# Patient Record
Sex: Male | Born: 1990 | Race: Black or African American | Hispanic: No | Marital: Single | Smoking: Never smoker
Health system: Southern US, Community
[De-identification: ages and names within clinical notes are randomized; demographics above are authoritative.]

---

## 2016-07-10 ENCOUNTER — Encounter: Payer: Self-pay | Admitting: *Deleted

## 2016-07-10 ENCOUNTER — Emergency Department: Payer: Self-pay

## 2016-07-10 ENCOUNTER — Emergency Department
Admission: EM | Admit: 2016-07-10 | Discharge: 2016-07-10 | Disposition: A | Discharge status: Home / Self Care | Payer: Self-pay | Attending: Emergency Medicine | Admitting: Emergency Medicine

## 2016-07-10 DIAGNOSIS — G43909 Migraine, unspecified, not intractable, without status migrainosus: Secondary | ICD-10-CM | POA: Insufficient documentation

## 2016-07-10 MED ORDER — KETOROLAC TROMETHAMINE 60 MG/2ML IM SOLN
60.0000 mg | Freq: Once | INTRAMUSCULAR | Status: AC
  Administered 2016-07-10: 60 mg via INTRAMUSCULAR
  Filled 2016-07-10: qty 2

## 2016-07-10 MED ORDER — ONDANSETRON 4 MG PO TBDP
ORAL_TABLET | ORAL | Status: AC
  Administered 2016-07-10: 4 mg via ORAL
  Filled 2016-07-10: qty 1

## 2016-07-10 MED ORDER — NAPROXEN 500 MG PO TBEC: 500.0000 mg | DELAYED_RELEASE_TABLET | Freq: Two times a day (BID) | ORAL | 0 refills | Status: AC

## 2016-07-10 MED ORDER — ONDANSETRON 4 MG PO TBDP
4.0000 mg | ORAL_TABLET | Freq: Once | ORAL | Status: AC
  Administered 2016-07-10: 4 mg via ORAL

## 2016-07-10 MED ORDER — BUTALBITAL-APAP-CAFFEINE 50-325-40 MG PO TABS
ORAL_TABLET | ORAL | Status: AC
  Administered 2016-07-10: 2 via ORAL
  Filled 2016-07-10: qty 1

## 2016-07-10 MED ORDER — BUTALBITAL-APAP-CAFFEINE 50-325-40 MG PO TABS
2.0000 | ORAL_TABLET | Freq: Once | ORAL | Status: AC
  Administered 2016-07-10: 2 via ORAL
  Filled 2016-07-10: qty 2

## 2016-07-10 NOTE — ED Provider Notes (Signed)
[facility] Emergency Department Provider Note  ____________________________________________   First MD Initiated Contact with Patient 07/10/16 1851     (approximate)  I have reviewed the triage vital signs and the nursing notes.   HISTORY  Chief Complaint Headache    HPI Russell Mason is a 25 y.o. male presenting with an intractable frontal headache described as "like lightning bolts"for the past 4 days. Patient denies photophobia but has phonophobia. He has been nauseated but no vomiting. Patient has used Tylenol Cold medicine which has not relieved his symptoms. He does not have a history of seasonal allergies or migraines. He rates his pain at 10 out of 10 in intensity, worse with ambulation. He states "this is the worst headache of my life". Headache has kept him from working today.   History reviewed. No pertinent past medical history.  There are no active problems to display for this patient.   History reviewed. No pertinent surgical history.  Prior to Admission medications   Medication Sig Start Date End Date Taking? Authorizing Provider  naproxen (EC NAPROSYN) 500 MG EC tablet Take 1 tablet (500 mg total) by mouth 2 (two) times daily with a meal. 07/10/16 07/10/17  Orvil FeilJaclyn M Tangi Shroff, PA-C    Allergies Patient has no known allergies.  No family history on file.  Social History Social History  Substance Use Topics  . Smoking status: Never Smoker  . Smokeless tobacco: Never Used  . Alcohol use No    Review of Systems Constitutional: No fever/chills Eyes: No visual changes. ENT: No sore throat. Cardiovascular: Denies chest pain. Respiratory: Denies shortness of breath. Gastrointestinal: No abdominal pain. Nausea but no vomiting. Genitourinary: Negative for dysuria. Musculoskeletal: Negative for back pain. Skin: Negative for rash. Neurological: Patient has headache.  10-point ROS otherwise  negative.  ____________________________________________   PHYSICAL EXAM:  VITAL SIGNS: ED Triage Vitals [07/10/16 1827]  Enc Vitals Group     BP (!) 147/100     Pulse Rate 76     Resp 20     Temp 98.1 F (36.7 C)     Temp Source Oral     SpO2 100 %     Weight 187 lb (84.8 kg)     Height 6\' 2"  (1.88 m)     Head Circumference      Peak Flow      Pain Score 9     Pain Loc      Pain Edu?      Excl. in GC?     Constitutional: Alert and oriented. Patient is lying supine on the bed. Eyes: Conjunctivae are normal. PERRL. EOMI. Accommodation within reference range. Head: Atraumatic. No tenderness along maxillary or frontal sinuses. Nose: No congestion/rhinnorhea. Nasal turbinates are not boggy. Mouth/Throat: Mucous membranes are moist.  Oropharynx non-erythematous. Neck: Full range of motion Hematological/Lymphatic/Immunilogical: No cervical lymphadenopathy. Cardiovascular: Normal rate, regular rhythm. Grossly normal heart sounds.  Good peripheral circulation. Respiratory: Normal respiratory effort.  No retractions. Lungs CTAB. Gastrointestinal: Soft and nontender. No distention. No abdominal bruits. No CVA tenderness. Neurologic: Normal speech and language. No gross focal neurologic deficits are appreciated. Cranial nerves: 2-10 normal as tested. Cerebellar: Finger-nose-finger WNL, heel to shin WNL. Sensorimotor: No pronator drift, clonus, sensory loss or abnormal reflexes. Vision: No visual field deficts noted to confrontation.  Speech: No dysarthria or expressive aphasia.  Skin:  Skin is warm, dry and intact. No rash noted. Psychiatric: Mood and affect are normal. Speech and behavior are normal.  ____________________________________________  LABS (all labs ordered are listed, but only abnormal results are displayed)  Labs Reviewed - No data to display ____________________________________________   RADIOLOGY  I, Orvil FeilJaclyn M Divon Krabill, personally viewed and evaluated these  images as part of my medical decision making, as well as reviewing the written report by the radiologist.  No evidence of acute infarction, hemorrhage, hydrocephalus,  extra-axial collection or mass lesion/mass effect.    Procedures  Fioricet (until CT results became available) Toradol (after CT results became available)  ____________________________________________   INITIAL IMPRESSION / ASSESSMENT AND PLAN / ED COURSE  Pertinent labs & imaging results that were available during my care of the patient were reviewed by me and considered in my medical decision making (see chart for details).    Clinical Course    Migraine: Patient states that migraine had improved with Fioricet and tramadol. CT of the head did not reveal intracranial bleed. He was prescribed naproxen to be used as needed for headache. Patient was advised to seek care with a primary care provider if headache symptoms persist.  ____________________________________________   FINAL CLINICAL IMPRESSION(S) / ED DIAGNOSES  Final diagnoses:  Migraine without status migrainosus, not intractable, unspecified migraine type      NEW MEDICATIONS STARTED DURING THIS VISIT:  There are no discharge medications for this patient.    Note:  This document was prepared using Dragon voice recognition software and may include unintentional dictation errors.    Orvil FeilJaclyn M Salwa Bai, PA-C 07/10/16 2158    Sharman CheekPhillip Stafford, MD 07/10/16 [phone]

## 2016-07-10 NOTE — ED Triage Notes (Signed)
Pt reports headache since Monday, pt reports similar headches in the past, pt reports " I cant get rid of this one", pt denies any other symptoms

## 2017-08-17 IMAGING — CT CT HEAD W/O CM
3 series · 15 of 47 positions shown, 18 images · non-contrast
Comparison: None.

CLINICAL DATA: 24 y/o  M; worst headache of life.

EXAM:
CT HEAD WITHOUT CONTRAST
TECHNIQUE: Contiguous axial images were obtained from the base of the skull
through the vertex without intravenous contrast.

[Series 2: head wo · axial · 0.43mm/px · z∈[-143,-18]mm · 9 of 31 slices shown, 12 images]
[im 3/31  brain]
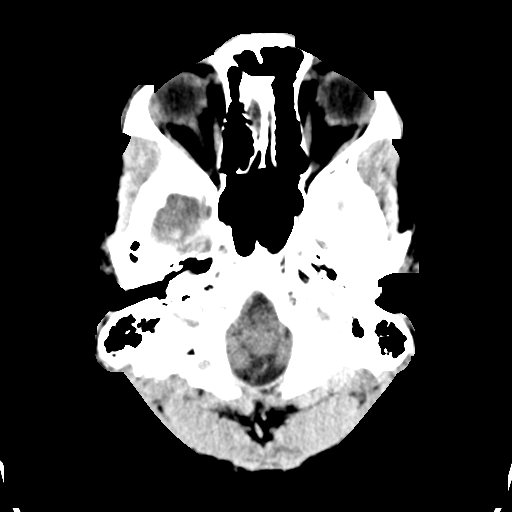
[im 3/31  bone]
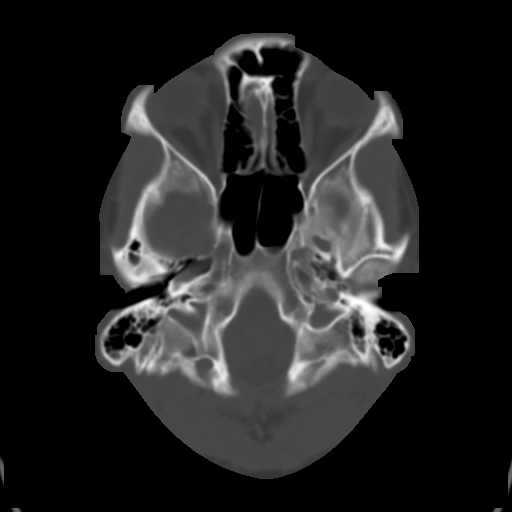
[im 6/31  brain]
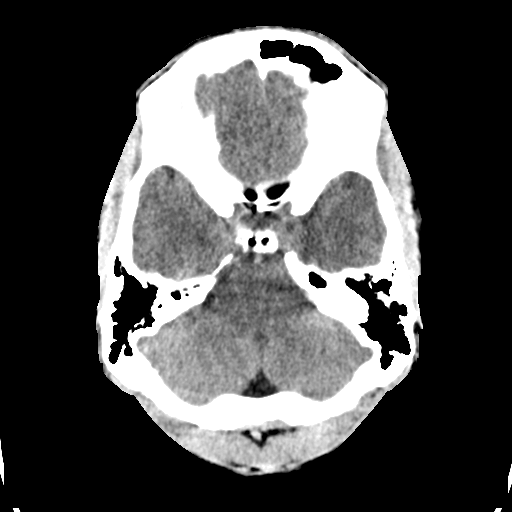
[im 9/31  brain]
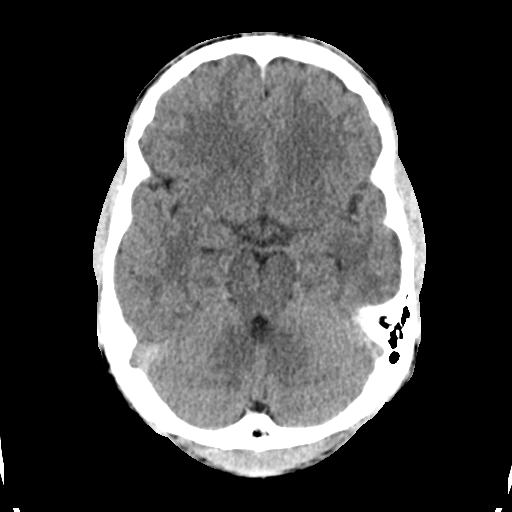
[im 12/31  brain]
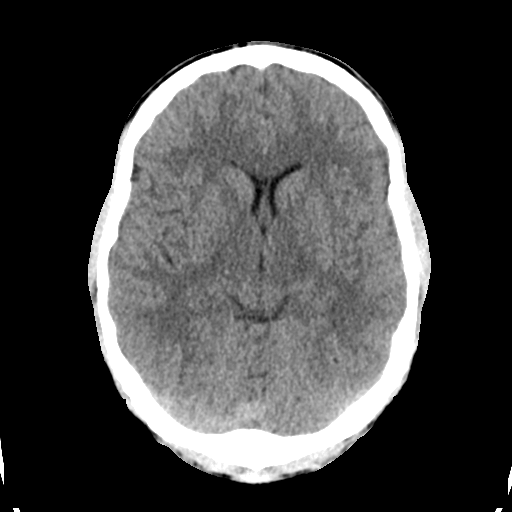
[im 16/31  brain]
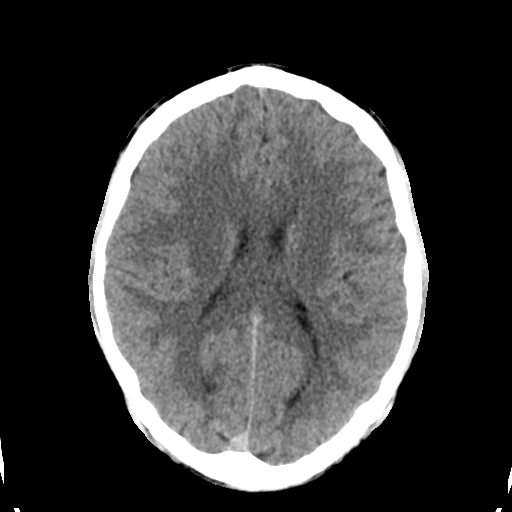
[im 16/31  bone]
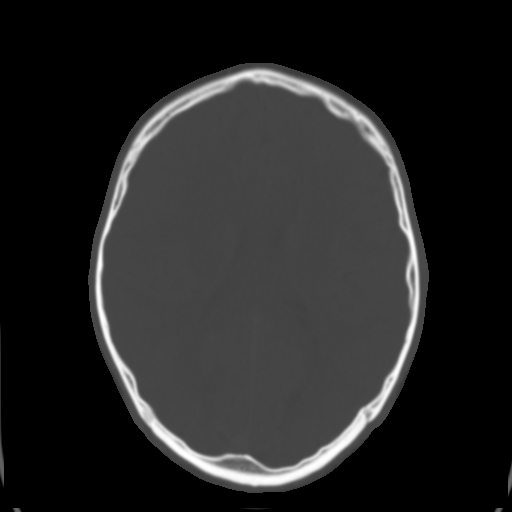
[im 19/31  brain]
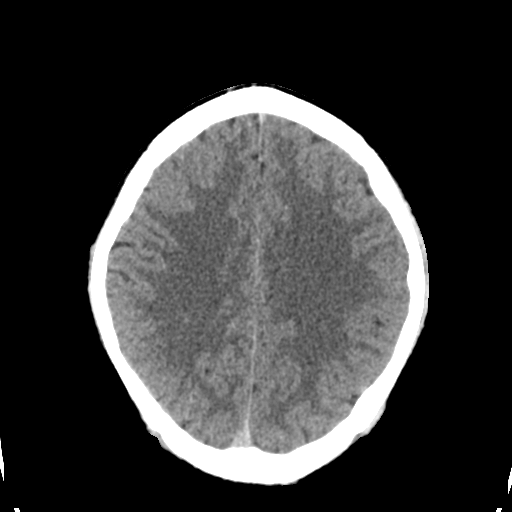
[im 22/31  brain]
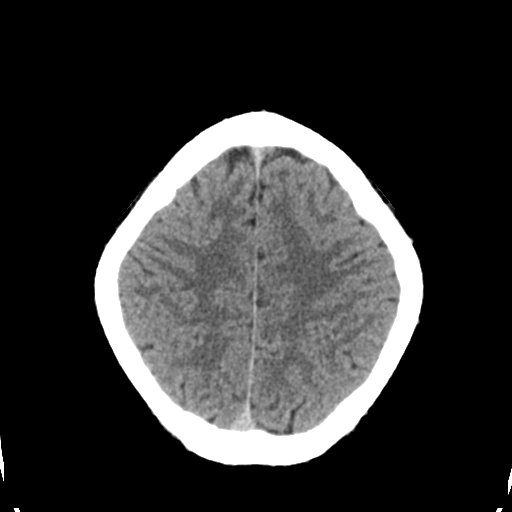
[im 25/31  brain]
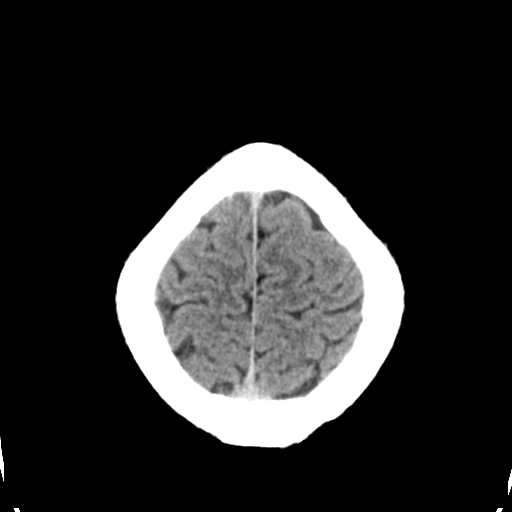
[im 28/31  brain]
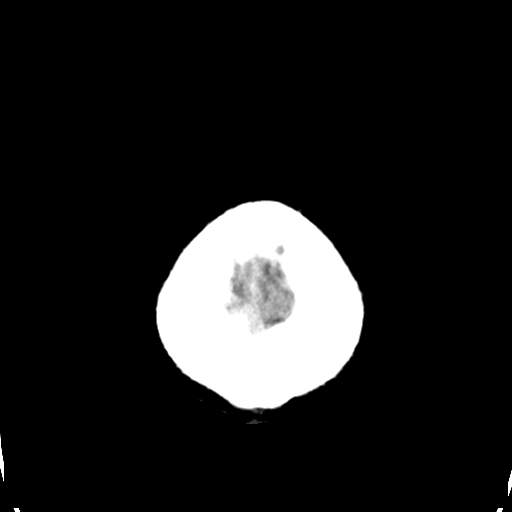
[im 28/31  bone]
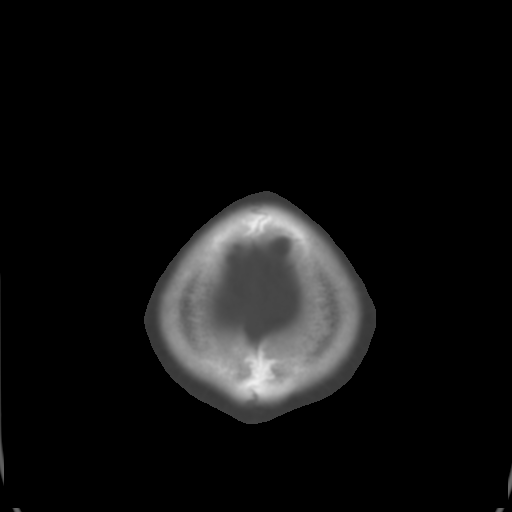

[Series 4: coronal soft tissue · coronal · 0.34mm/px · 3 of 66 slices shown]
[im 22/66  brain]
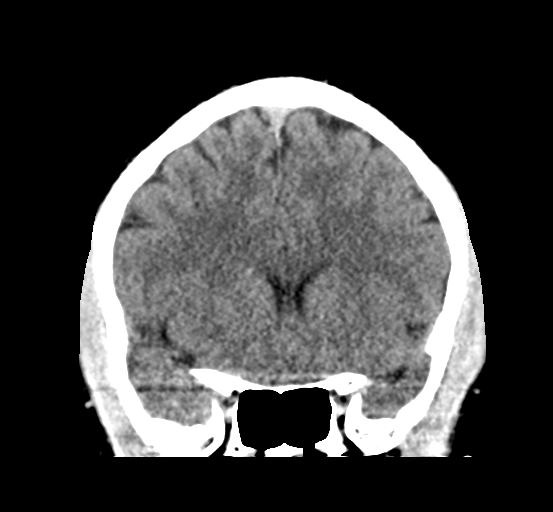
[im 29/66  brain]
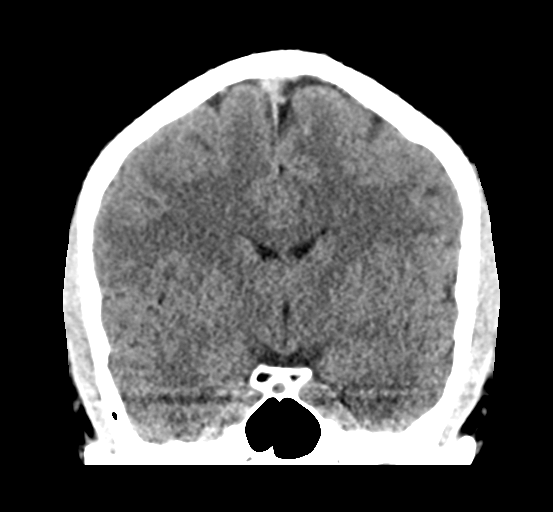
[im 37/66  brain]
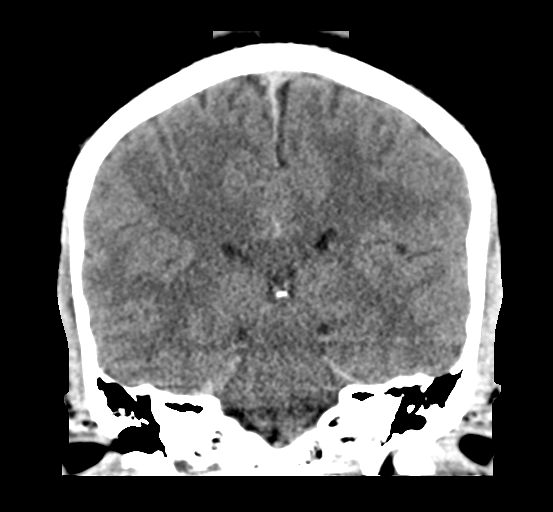

[Series 5: sagittal soft tissue · sagittal · 0.33mm/px · 3 of 52 slices shown]
[im 18/52  brain]
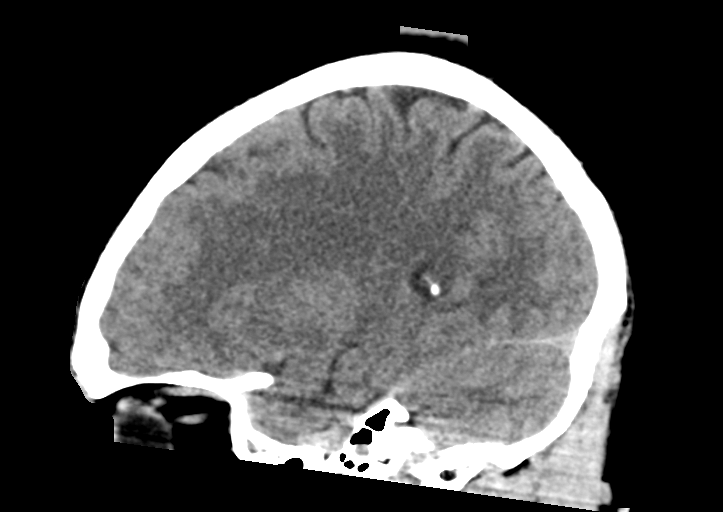
[im 26/52  brain]
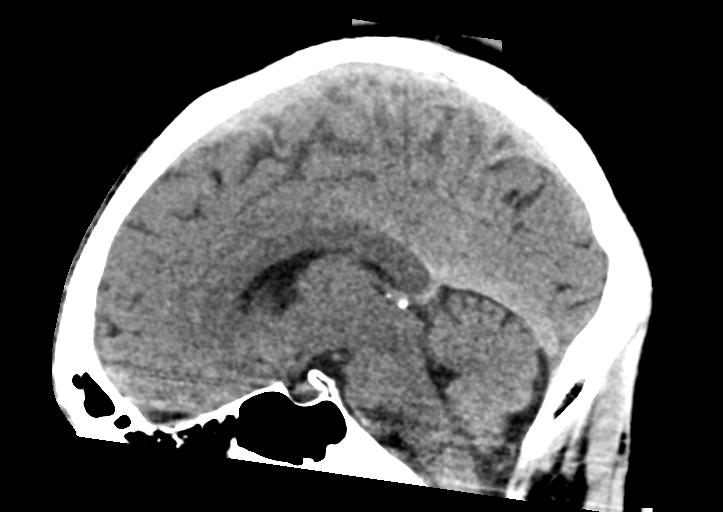
[im 35/52  brain]
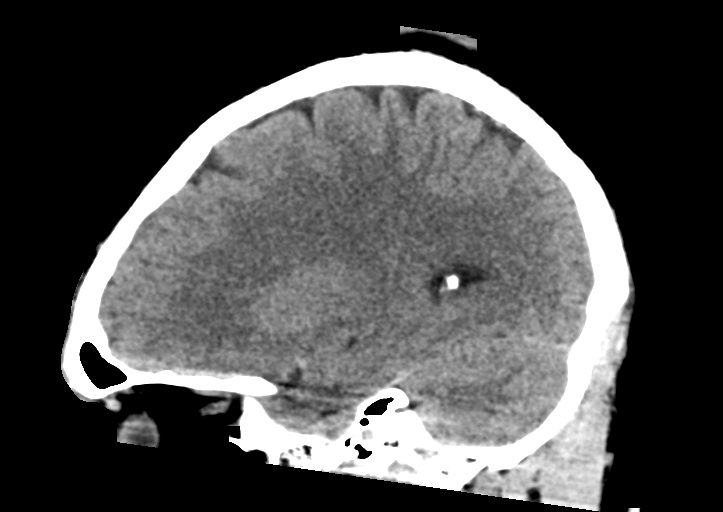

[15 of 47 positions shown; findings below may reference images not displayed]

FINDINGS: Brain: No evidence of acute infarction, hemorrhage, hydrocephalus,
extra-axial collection or mass lesion/mass effect.

Vascular: No hyperdense vessel or unexpected calcification.

Skull: Normal. Negative for fracture or focal lesion.

Sinuses/Orbits: No acute finding.

Other: None.
IMPRESSION: No acute intracranial abnormality identified.  Normal CT of head.

By: Jonthe Vendel M.D.
